# Patient Record
Sex: Male | Born: 1990 | Race: White | Hispanic: No | Marital: Single | Smoking: Current every day smoker
Health system: Southern US, Community
[De-identification: ages and names within clinical notes are randomized; demographics above are authoritative.]

## PROBLEM LIST (undated history)

## (undated) DIAGNOSIS — Z8669 Personal history of other diseases of the nervous system and sense organs: Secondary | ICD-10-CM

## (undated) HISTORY — PX: TYMPANOSTOMY TUBE PLACEMENT: SHX32

---
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
## 2015-03-23 ENCOUNTER — Encounter (HOSPITAL_COMMUNITY): Payer: Self-pay | Admitting: Emergency Medicine

## 2015-03-23 ENCOUNTER — Observation Stay (HOSPITAL_COMMUNITY)
Admission: EM | Admit: 2015-03-23 | Discharge: 2015-03-25 | Disposition: A | Discharge status: Home / Self Care | Payer: 59 | Attending: General Surgery | Admitting: General Surgery

## 2015-03-23 DIAGNOSIS — K3580 Unspecified acute appendicitis: Secondary | ICD-10-CM | POA: Diagnosis present

## 2015-03-23 DIAGNOSIS — K37 Unspecified appendicitis: Secondary | ICD-10-CM | POA: Diagnosis present

## 2015-03-23 DIAGNOSIS — F17210 Nicotine dependence, cigarettes, uncomplicated: Secondary | ICD-10-CM | POA: Diagnosis not present

## 2015-03-23 HISTORY — DX: Personal history of other diseases of the nervous system and sense organs: Z86.69

## 2015-03-23 LAB — COMPREHENSIVE METABOLIC PANEL
ALT: 13 U/L — AB (ref 17–63)
ANION GAP: 12 (ref 5–15)
AST: 17 U/L (ref 15–41)
Albumin: 4.6 g/dL (ref 3.5–5.0)
Alkaline Phosphatase: 33 U/L — ABNORMAL LOW (ref 38–126)
BUN: 10 mg/dL (ref 6–20)
CALCIUM: 9.7 mg/dL (ref 8.9–10.3)
CHLORIDE: 101 mmol/L (ref 101–111)
CO2: 24 mmol/L (ref 22–32)
Creatinine, Ser: 1.06 mg/dL (ref 0.61–1.24)
GLUCOSE: 92 mg/dL (ref 65–99)
POTASSIUM: 3.4 mmol/L — AB (ref 3.5–5.1)
Sodium: 137 mmol/L (ref 135–145)
TOTAL PROTEIN: 7.5 g/dL (ref 6.5–8.1)
Total Bilirubin: 1 mg/dL (ref 0.3–1.2)

## 2015-03-23 LAB — CBC WITH DIFFERENTIAL/PLATELET
BASOS ABS: 0 10*3/uL (ref 0.0–0.1)
Basophils Relative: 0 % (ref 0–1)
EOS ABS: 0.1 10*3/uL (ref 0.0–0.7)
Eosinophils Relative: 1 % (ref 0–5)
HCT: 43.7 % (ref 39.0–52.0)
Hemoglobin: 15.7 g/dL (ref 13.0–17.0)
Lymphocytes Relative: 22 % (ref 12–46)
Lymphs Abs: 2.5 10*3/uL (ref 0.7–4.0)
MCH: 32.9 pg (ref 26.0–34.0)
MCHC: 35.9 g/dL (ref 30.0–36.0)
MCV: 91.6 fL (ref 78.0–100.0)
Monocytes Absolute: 1.2 10*3/uL — ABNORMAL HIGH (ref 0.1–1.0)
Monocytes Relative: 10 % (ref 3–12)
NEUTROS ABS: 7.9 10*3/uL — AB (ref 1.7–7.7)
NEUTROS PCT: 67 % (ref 43–77)
PLATELETS: 184 10*3/uL (ref 150–400)
RBC: 4.77 MIL/uL (ref 4.22–5.81)
RDW: 11.7 % (ref 11.5–15.5)
WBC: 11.7 10*3/uL — ABNORMAL HIGH (ref 4.0–10.5)

## 2015-03-23 LAB — URINALYSIS, ROUTINE W REFLEX MICROSCOPIC
Bilirubin Urine: NEGATIVE
Glucose, UA: NEGATIVE mg/dL
Hgb urine dipstick: NEGATIVE
Ketones, ur: NEGATIVE mg/dL
LEUKOCYTES UA: NEGATIVE
NITRITE: NEGATIVE
PROTEIN: NEGATIVE mg/dL
SPECIFIC GRAVITY, URINE: 1.025 (ref 1.005–1.030)
UROBILINOGEN UA: 0.2 mg/dL (ref 0.0–1.0)
pH: 7.5 (ref 5.0–8.0)

## 2015-03-23 LAB — LIPASE, BLOOD: LIPASE: 18 U/L — AB (ref 22–51)

## 2015-03-23 MED ORDER — ONDANSETRON HCL 4 MG/2ML IJ SOLN
4.0000 mg | Freq: Once | INTRAMUSCULAR | Status: AC
  Administered 2015-03-23: 4 mg via INTRAVENOUS
  Filled 2015-03-23: qty 2

## 2015-03-23 MED ORDER — LOPERAMIDE HCL 2 MG PO CAPS
4.0000 mg | ORAL_CAPSULE | Freq: Once | ORAL | Status: DC
  Filled 2015-03-23: qty 2

## 2015-03-23 MED ORDER — SODIUM CHLORIDE 0.9 % IV BOLUS (SEPSIS)
1000.0000 mL | Freq: Once | INTRAVENOUS | Status: AC
  Administered 2015-03-23: 1000 mL via INTRAVENOUS

## 2015-03-23 MED ORDER — MORPHINE SULFATE 4 MG/ML IJ SOLN
4.0000 mg | Freq: Once | INTRAMUSCULAR | Status: AC
  Administered 2015-03-23: 4 mg via INTRAVENOUS
  Filled 2015-03-23: qty 1

## 2015-03-23 NOTE — ED Provider Notes (Signed)
CSN: [id number]     Arrival date & time 03/23/15  2036 History  This chart was scribed for Shon Baton, MD by Annye Asa, ED Scribe. This patient was seen in room B15C/B15C and the patient's care was started at 11:35 PM.    Chief Complaint  Patient presents with  . Abdominal Pain   The history is provided by the patient. No language interpreter was used.    HPI Comments: Keith Glenn is a 24 y.o. male who presents to the Emergency Department complaining of 1 day of gradually worsening intermittent abdominal pain with diarrhea and vomiting (3x). He describes his pain as a "tightness," currently rated 7-8/10; it is increased with movement and sitting upright; it improves when lying supine. Patient explains his pain originated in the periumbilical region and then radiated to the RLQ throughout the day. He states he has not been able to have a bowel movement since this morning and has had decreased appetite today. Patient reports he attempted to rest without relief; no other treatments or medications tried PTA.   He has both his gallbladder and appendix. He does not take any daily medications; no known drug allergies. He is an everyday smoker.   History reviewed. No pertinent past medical history. History reviewed. No pertinent past surgical history. No family history on file. History  Substance Use Topics  . Smoking status: Current Every Day Smoker  . Smokeless tobacco: Not on file  . Alcohol Use: Yes    Review of Systems  Constitutional: Negative.  Negative for fever.  Respiratory: Negative.  Negative for chest tightness and shortness of breath.   Cardiovascular: Negative.  Negative for chest pain.  Gastrointestinal: Positive for nausea, vomiting, abdominal pain and diarrhea.  Genitourinary: Negative.  Negative for dysuria.  Musculoskeletal: Negative for back pain.  Neurological: Negative for headaches.  All other systems reviewed and are negative.     Allergies  Review  of patient's allergies indicates no known allergies.  Home Medications   Prior to Admission medications   Not on File   BP 122/86 mmHg  Pulse 77  Temp(Src) 99.1 F (37.3 C) (Oral)  Resp 16  SpO2 99% Physical Exam  Constitutional: He is oriented to person, place, and time. He appears well-developed and well-nourished.  Tall, thin  HENT:  Head: Normocephalic and atraumatic.  Eyes: Pupils are equal, round, and reactive to light.  Cardiovascular: Normal rate, regular rhythm and normal heart sounds.   No murmur heard. Pulmonary/Chest: Effort normal and breath sounds normal. No respiratory distress. He has no wheezes.  Abdominal: Soft. Bowel sounds are normal. There is tenderness. There is no rebound.  Tenderness to palpation over the right lower quadrant, no rebound or guarding, positive Rovsing's  Musculoskeletal: He exhibits no edema.  Neurological: He is alert and oriented to person, place, and time.  Skin: Skin is warm and dry.  Psychiatric: He has a normal mood and affect.  Nursing note and vitals reviewed.   ED Course  Procedures   DIAGNOSTIC STUDIES: Oxygen Saturation is 100% on RA, normal by my interpretation.    COORDINATION OF CARE: 11:42 PM Discussed treatment plan with pt at bedside and pt agreed to plan.   Labs Review Labs Reviewed  CBC WITH DIFFERENTIAL/PLATELET - Abnormal; Notable for the following:    WBC 11.7 (*)    Neutro Abs 7.9 (*)    Monocytes Absolute 1.2 (*)    All other components within normal limits  COMPREHENSIVE METABOLIC PANEL - Abnormal;  Notable for the following:    Potassium 3.4 (*)    ALT 13 (*)    Alkaline Phosphatase 33 (*)    All other components within normal limits  LIPASE, BLOOD - Abnormal; Notable for the following:    Lipase 18 (*)    All other components within normal limits  URINALYSIS, ROUTINE W REFLEX MICROSCOPIC (NOT AT [facility]) - Abnormal; Notable for the following:    APPearance CLOUDY (*)    All other components within  normal limits    Imaging Review Ct Abdomen Pelvis W Contrast  03/24/2015   CLINICAL DATA:  Abdominal pain  EXAM: CT ABDOMEN AND PELVIS WITH CONTRAST  TECHNIQUE: Multidetector CT imaging of the abdomen and pelvis was performed using the standard protocol following bolus administration of intravenous contrast.  CONTRAST:  100 mL Isovue 370 intravenous  COMPARISON:  None.  FINDINGS: There is moderate dilatation and mural thickening of the appendix. There is an appendicolith at the appendiceal base. The findings are typical of acute appendicitis. There is no abscess. There is no drainable fluid collection.  There are normal appearances of the liver, spleen, pancreas, adrenals and kidneys.  There is no extraluminal air. There is no bowel obstruction. No other acute findings are evident.  There is no significant abnormality in the lower chest.  IMPRESSION: Acute appendicitis.   Electronically Signed   By: Ellery Plunkaniel R Mitchell M.D.   On: 03/24/2015 01:30     EKG Interpretation None      MDM   Final diagnoses:  Acute appendicitis, unspecified acute appendicitis type   Patient presents with abdominal pain.  Nontoxic. Tender on exam with no signs of peritonitis. CT scan obtained to rule out appendicitis. CT scan with evidence of acute appendicitis. Surgery, Dr. Dwain SarnaWakefield consulted. Will admit.  I personally performed the services described in this documentation, which was scribed in my presence. The recorded information has been reviewed and is accurate.      Shon Batonourtney F Cherly Erno, MD 03/24/15 0200

## 2015-03-23 NOTE — ED Notes (Signed)
Pt. reports mid/low abdominal pain with emesis and diarrhea onset this morning , denies fever or chills.

## 2015-03-24 ENCOUNTER — Encounter (HOSPITAL_COMMUNITY): Payer: Self-pay | Admitting: Vascular Surgery

## 2015-03-24 ENCOUNTER — Encounter (HOSPITAL_COMMUNITY)
Admission: EM | Disposition: A | Discharge status: Home / Self Care | Payer: Self-pay | Source: Home / Self Care | Attending: Emergency Medicine

## 2015-03-24 ENCOUNTER — Observation Stay (HOSPITAL_COMMUNITY): Payer: 59 | Admitting: Certified Registered"

## 2015-03-24 ENCOUNTER — Emergency Department (HOSPITAL_COMMUNITY): Payer: 59

## 2015-03-24 DIAGNOSIS — K37 Unspecified appendicitis: Secondary | ICD-10-CM | POA: Diagnosis present

## 2015-03-24 HISTORY — PX: LAPAROSCOPIC APPENDECTOMY: SHX408

## 2015-03-24 HISTORY — PX: APPENDECTOMY: SHX54

## 2015-03-24 SURGERY — APPENDECTOMY, LAPAROSCOPIC
Anesthesia: General | Site: Abdomen

## 2015-03-24 MED ORDER — LIDOCAINE HCL (CARDIAC) 20 MG/ML IV SOLN
INTRAVENOUS | Status: AC
  Filled 2015-03-24: qty 5

## 2015-03-24 MED ORDER — ONDANSETRON HCL 4 MG/2ML IJ SOLN
INTRAMUSCULAR | Status: AC
  Filled 2015-03-24: qty 2

## 2015-03-24 MED ORDER — PANTOPRAZOLE SODIUM 40 MG IV SOLR
40.0000 mg | INTRAVENOUS | Status: DC
  Administered 2015-03-24: 40 mg via INTRAVENOUS
  Filled 2015-03-24: qty 40

## 2015-03-24 MED ORDER — SUCCINYLCHOLINE CHLORIDE 20 MG/ML IJ SOLN
INTRAMUSCULAR | Status: DC | PRN
  Administered 2015-03-24: 120 mg via INTRAVENOUS

## 2015-03-24 MED ORDER — ENOXAPARIN SODIUM 40 MG/0.4ML ~~LOC~~ SOLN
40.0000 mg | SUBCUTANEOUS | Status: DC
  Administered 2015-03-25: 40 mg via SUBCUTANEOUS
  Filled 2015-03-24: qty 0.4

## 2015-03-24 MED ORDER — MIDAZOLAM HCL 5 MG/5ML IJ SOLN
INTRAMUSCULAR | Status: DC | PRN
  Administered 2015-03-24: 2 mg via INTRAVENOUS

## 2015-03-24 MED ORDER — DEXAMETHASONE SODIUM PHOSPHATE 4 MG/ML IJ SOLN
INTRAMUSCULAR | Status: AC
  Filled 2015-03-24: qty 1

## 2015-03-24 MED ORDER — FENTANYL CITRATE (PF) 100 MCG/2ML IJ SOLN
INTRAMUSCULAR | Status: DC | PRN
  Administered 2015-03-24: 100 ug via INTRAVENOUS

## 2015-03-24 MED ORDER — HYDROMORPHONE HCL 1 MG/ML IJ SOLN
0.2500 mg | INTRAMUSCULAR | Status: DC | PRN
  Administered 2015-03-24 (×4): 0.5 mg via INTRAVENOUS

## 2015-03-24 MED ORDER — LIDOCAINE HCL (CARDIAC) 20 MG/ML IV SOLN
INTRAVENOUS | Status: DC | PRN
  Administered 2015-03-24: 40 mg via INTRAVENOUS

## 2015-03-24 MED ORDER — GI COCKTAIL ~~LOC~~
30.0000 mL | Freq: Two times a day (BID) | ORAL | Status: DC | PRN
  Filled 2015-03-24: qty 30

## 2015-03-24 MED ORDER — FENTANYL CITRATE (PF) 250 MCG/5ML IJ SOLN
INTRAMUSCULAR | Status: AC
  Filled 2015-03-24: qty 5

## 2015-03-24 MED ORDER — BUPIVACAINE-EPINEPHRINE 0.25% -1:200000 IJ SOLN
INTRAMUSCULAR | Status: DC | PRN
  Administered 2015-03-24: 30 mL

## 2015-03-24 MED ORDER — DEXAMETHASONE SODIUM PHOSPHATE 4 MG/ML IJ SOLN
INTRAMUSCULAR | Status: DC | PRN
  Administered 2015-03-24: 4 mg via INTRAVENOUS

## 2015-03-24 MED ORDER — MIDAZOLAM HCL 2 MG/2ML IJ SOLN
INTRAMUSCULAR | Status: AC
  Filled 2015-03-24: qty 2

## 2015-03-24 MED ORDER — ROCURONIUM BROMIDE 100 MG/10ML IV SOLN
INTRAVENOUS | Status: DC | PRN
  Administered 2015-03-24: 25 mg via INTRAVENOUS

## 2015-03-24 MED ORDER — SODIUM CHLORIDE 0.9 % IV SOLN
INTRAVENOUS | Status: DC
  Administered 2015-03-24: 14:00:00 via INTRAVENOUS
  Administered 2015-03-24: 500 mL via INTRAVENOUS

## 2015-03-24 MED ORDER — ARTIFICIAL TEARS OP OINT
TOPICAL_OINTMENT | OPHTHALMIC | Status: AC
  Filled 2015-03-24: qty 3.5

## 2015-03-24 MED ORDER — LACTATED RINGERS IV SOLN
INTRAVENOUS | Status: DC
Start: 2015-03-24 — End: 2015-03-24
  Administered 2015-03-24: 10:00:00 via INTRAVENOUS

## 2015-03-24 MED ORDER — PROMETHAZINE HCL 25 MG/ML IJ SOLN: 6.2500 mg | INTRAMUSCULAR | Status: DC | PRN

## 2015-03-24 MED ORDER — SODIUM CHLORIDE 0.9 % IR SOLN
Status: DC | PRN
  Administered 2015-03-24: 1000 mL

## 2015-03-24 MED ORDER — PROPOFOL 10 MG/ML IV BOLUS
INTRAVENOUS | Status: AC
  Filled 2015-03-24: qty 20

## 2015-03-24 MED ORDER — NEOSTIGMINE METHYLSULFATE 10 MG/10ML IV SOLN
INTRAVENOUS | Status: AC
  Filled 2015-03-24: qty 1

## 2015-03-24 MED ORDER — METRONIDAZOLE IN NACL 5-0.79 MG/ML-% IV SOLN
500.0000 mg | Freq: Three times a day (TID) | INTRAVENOUS | Status: DC
  Administered 2015-03-24 (×2): 500 mg via INTRAVENOUS
  Filled 2015-03-24 (×4): qty 100

## 2015-03-24 MED ORDER — NEOSTIGMINE METHYLSULFATE 10 MG/10ML IV SOLN
INTRAVENOUS | Status: DC | PRN
  Administered 2015-03-24: 3 mg via INTRAVENOUS

## 2015-03-24 MED ORDER — HYDROMORPHONE HCL 1 MG/ML IJ SOLN
1.0000 mg | INTRAMUSCULAR | Status: DC | PRN
  Administered 2015-03-24: 1 mg via INTRAVENOUS
  Filled 2015-03-24: qty 1

## 2015-03-24 MED ORDER — ONDANSETRON HCL 4 MG/2ML IJ SOLN
4.0000 mg | Freq: Four times a day (QID) | INTRAMUSCULAR | Status: DC | PRN
  Administered 2015-03-24: 4 mg via INTRAVENOUS
  Filled 2015-03-24: qty 2

## 2015-03-24 MED ORDER — OXYCODONE-ACETAMINOPHEN 5-325 MG PO TABS
1.0000 | ORAL_TABLET | ORAL | Status: DC | PRN
  Administered 2015-03-24: 1 via ORAL
  Administered 2015-03-24: 2 via ORAL
  Administered 2015-03-24: 1 via ORAL
  Administered 2015-03-25: 2 via ORAL
  Filled 2015-03-24: qty 1
  Filled 2015-03-24: qty 2
  Filled 2015-03-24: qty 1
  Filled 2015-03-24 (×2): qty 2

## 2015-03-24 MED ORDER — MORPHINE SULFATE 2 MG/ML IJ SOLN
2.0000 mg | INTRAMUSCULAR | Status: DC | PRN
  Administered 2015-03-24 (×2): 2 mg via INTRAVENOUS
  Filled 2015-03-24 (×2): qty 1

## 2015-03-24 MED ORDER — HYDROMORPHONE HCL 1 MG/ML IJ SOLN
INTRAMUSCULAR | Status: AC
  Filled 2015-03-24: qty 1

## 2015-03-24 MED ORDER — PROPOFOL 10 MG/ML IV BOLUS
INTRAVENOUS | Status: DC | PRN
  Administered 2015-03-24: 270 mg via INTRAVENOUS

## 2015-03-24 MED ORDER — MORPHINE SULFATE 2 MG/ML IJ SOLN
1.0000 mg | INTRAMUSCULAR | Status: DC | PRN
  Administered 2015-03-24: 2 mg via INTRAVENOUS
  Administered 2015-03-24: 3 mg via INTRAVENOUS
  Administered 2015-03-24: 2 mg via INTRAVENOUS
  Filled 2015-03-24 (×4): qty 1

## 2015-03-24 MED ORDER — MEPERIDINE HCL 25 MG/ML IJ SOLN: 6.2500 mg | INTRAMUSCULAR | Status: DC | PRN

## 2015-03-24 MED ORDER — HYDROMORPHONE HCL 1 MG/ML IJ SOLN
INTRAMUSCULAR | Status: AC
  Administered 2015-03-24: 0.5 mg via INTRAVENOUS
  Filled 2015-03-24: qty 1

## 2015-03-24 MED ORDER — HYDROMORPHONE HCL 1 MG/ML IJ SOLN
INTRAMUSCULAR | Status: AC
  Administered 2015-03-24: 02:00:00
  Filled 2015-03-24: qty 1

## 2015-03-24 MED ORDER — MIDAZOLAM HCL 2 MG/2ML IJ SOLN: 0.5000 mg | Freq: Once | INTRAMUSCULAR | Status: DC | PRN

## 2015-03-24 MED ORDER — GLYCOPYRROLATE 0.2 MG/ML IJ SOLN
INTRAMUSCULAR | Status: AC
  Filled 2015-03-24: qty 2

## 2015-03-24 MED ORDER — 0.9 % SODIUM CHLORIDE (POUR BTL) OPTIME
TOPICAL | Status: DC | PRN
  Administered 2015-03-24: 1000 mL

## 2015-03-24 MED ORDER — GLYCOPYRROLATE 0.2 MG/ML IJ SOLN
INTRAMUSCULAR | Status: DC | PRN
  Administered 2015-03-24: 0.4 mg via INTRAVENOUS

## 2015-03-24 MED ORDER — BUPIVACAINE-EPINEPHRINE (PF) 0.25% -1:200000 IJ SOLN
INTRAMUSCULAR | Status: AC
  Filled 2015-03-24: qty 30

## 2015-03-24 MED ORDER — KETOROLAC TROMETHAMINE 30 MG/ML IJ SOLN
INTRAMUSCULAR | Status: DC | PRN
  Administered 2015-03-24: 30 mg via INTRAVENOUS

## 2015-03-24 MED ORDER — DEXTROSE 5 % IV SOLN
2.0000 g | INTRAVENOUS | Status: DC
  Administered 2015-03-24: 2 g via INTRAVENOUS
  Filled 2015-03-24: qty 2

## 2015-03-24 MED ORDER — ONDANSETRON HCL 4 MG/2ML IJ SOLN
INTRAMUSCULAR | Status: DC | PRN
  Administered 2015-03-24: 4 mg via INTRAVENOUS

## 2015-03-24 MED ORDER — IOHEXOL 300 MG/ML  SOLN
100.0000 mL | Freq: Once | INTRAMUSCULAR | Status: AC | PRN
  Administered 2015-03-24: 100 mL via INTRAVENOUS

## 2015-03-24 MED ORDER — KETOROLAC TROMETHAMINE 30 MG/ML IJ SOLN
30.0000 mg | Freq: Three times a day (TID) | INTRAMUSCULAR | Status: DC | PRN
  Administered 2015-03-24: 30 mg via INTRAVENOUS
  Filled 2015-03-24: qty 1

## 2015-03-24 MED ORDER — ROCURONIUM BROMIDE 50 MG/5ML IV SOLN
INTRAVENOUS | Status: AC
  Filled 2015-03-24: qty 1

## 2015-03-24 SURGICAL SUPPLY — 49 items
APPLIER CLIP ROT 10 11.4 M/L (STAPLE)
BENZOIN TINCTURE PRP APPL 2/3 (GAUZE/BANDAGES/DRESSINGS) IMPLANT
BLADE SURG ROTATE 9660 (MISCELLANEOUS) ×2 IMPLANT
CANISTER SUCTION 2500CC (MISCELLANEOUS) ×2 IMPLANT
CHLORAPREP W/TINT 26ML (MISCELLANEOUS) ×2 IMPLANT
CLIP APPLIE ROT 10 11.4 M/L (STAPLE) IMPLANT
COVER SURGICAL LIGHT HANDLE (MISCELLANEOUS) ×2 IMPLANT
CUTTER FLEX LINEAR 45M (STAPLE) ×2 IMPLANT
DRAPE LAPAROSCOPIC ABDOMINAL (DRAPES) ×2 IMPLANT
ELECT REM PT RETURN 9FT ADLT (ELECTROSURGICAL) ×2
ELECTRODE REM PT RTRN 9FT ADLT (ELECTROSURGICAL) ×1 IMPLANT
ENDOLOOP SUT PDS II  0 18 (SUTURE)
ENDOLOOP SUT PDS II 0 18 (SUTURE) IMPLANT
GAUZE SPONGE 2X2 8PLY STRL LF (GAUZE/BANDAGES/DRESSINGS) IMPLANT
GLOVE BIO SURGEON STRL SZ 6.5 (GLOVE) ×2 IMPLANT
GLOVE BIOGEL M STRL SZ7.5 (GLOVE) ×2 IMPLANT
GLOVE BIOGEL PI IND STRL 6 (GLOVE) ×1 IMPLANT
GLOVE BIOGEL PI IND STRL 7.0 (GLOVE) ×2 IMPLANT
GLOVE BIOGEL PI IND STRL 8 (GLOVE) ×1 IMPLANT
GLOVE BIOGEL PI INDICATOR 6 (GLOVE) ×1
GLOVE BIOGEL PI INDICATOR 7.0 (GLOVE) ×2
GLOVE BIOGEL PI INDICATOR 8 (GLOVE) ×1
GLOVE SURG SS PI 7.0 STRL IVOR (GLOVE) ×2 IMPLANT
GOWN STRL REUS W/ TWL LRG LVL3 (GOWN DISPOSABLE) ×2 IMPLANT
GOWN STRL REUS W/ TWL XL LVL3 (GOWN DISPOSABLE) ×1 IMPLANT
GOWN STRL REUS W/TWL LRG LVL3 (GOWN DISPOSABLE) ×2
GOWN STRL REUS W/TWL XL LVL3 (GOWN DISPOSABLE) ×1
KIT BASIN OR (CUSTOM PROCEDURE TRAY) ×2 IMPLANT
KIT ROOM TURNOVER OR (KITS) ×2 IMPLANT
LIQUID BAND (GAUZE/BANDAGES/DRESSINGS) ×2 IMPLANT
NS IRRIG 1000ML POUR BTL (IV SOLUTION) ×2 IMPLANT
PAD ARMBOARD 7.5X6 YLW CONV (MISCELLANEOUS) ×4 IMPLANT
POUCH SPECIMEN RETRIEVAL 10MM (ENDOMECHANICALS) ×2 IMPLANT
RELOAD 45 VASCULAR/THIN (ENDOMECHANICALS) IMPLANT
RELOAD STAPLE TA45 3.5 REG BLU (ENDOMECHANICALS) ×2 IMPLANT
SCALPEL HARMONIC ACE (MISCELLANEOUS) ×2 IMPLANT
SCISSORS LAP 5X35 DISP (ENDOMECHANICALS) IMPLANT
SET IRRIG TUBING LAPAROSCOPIC (IRRIGATION / IRRIGATOR) ×2 IMPLANT
SPECIMEN JAR SMALL (MISCELLANEOUS) ×2 IMPLANT
SPONGE GAUZE 2X2 STER 10/PKG (GAUZE/BANDAGES/DRESSINGS)
SUT MNCRL AB 4-0 PS2 18 (SUTURE) ×2 IMPLANT
SUT VICRYL 0 UR6 27IN ABS (SUTURE) IMPLANT
TOWEL OR 17X24 6PK STRL BLUE (TOWEL DISPOSABLE) ×2 IMPLANT
TOWEL OR 17X26 10 PK STRL BLUE (TOWEL DISPOSABLE) ×2 IMPLANT
TRAY FOLEY CATH 16FR SILVER (SET/KITS/TRAYS/PACK) ×2 IMPLANT
TRAY LAPAROSCOPIC (CUSTOM PROCEDURE TRAY) ×2 IMPLANT
TROCAR XCEL BLADELESS 5X75MML (TROCAR) ×4 IMPLANT
TROCAR XCEL BLUNT TIP 100MML (ENDOMECHANICALS) ×2 IMPLANT
TUBING INSUFFLATION (TUBING) ×2 IMPLANT

## 2015-03-24 NOTE — ED Notes (Signed)
See downtime charting. 

## 2015-03-24 NOTE — Interval H&P Note (Signed)
History and Physical Interval Note:  03/24/2015 10:23 AM  Keith Glenn  has presented today for surgery, with the diagnosis of Appendicitis  The various methods of treatment have been discussed with the patient and family. After consideration of risks, benefits and other options for treatment, the patient has consented to  Procedure(s): APPENDECTOMY LAPAROSCOPIC (N/A) as a surgical intervention .  The patient's history has been reviewed, patient examined, no change in status, stable for surgery.  I have reviewed the patient's chart and labs.  Questions were answered to the patient's satisfaction.    Chart reviewed. History reobtained Alert, nontoxic Soft, TTP throughout. Localized peritonitis in RLQ.   We discussed the etiology and management of acute appendicitis. We discussed operative and nonoperative management.  I recommended operative management along with IV antibiotics.  We discussed laparoscopic appendectomy. We discussed the risk and benefits of surgery including but not limited to bleeding, infection, injury to surrounding structures, need to convert to an open procedure, blood clot formation, post operative abscess or wound infection, staple line complications such as leak or bleeding, hernia formation, post operative ileus, need for additional procedures, anesthesia complications, and the typical postoperative course. I explained that the patient should expect a good improvement in their symptoms.  Mary SellaEric M. Andrey CampanileWilson, MD, FACS General, Bariatric, & Minimally Invasive Surgery [facility] [place]  [facility],[name]

## 2015-03-24 NOTE — Anesthesia Postprocedure Evaluation (Signed)
  Anesthesia Post-op Note  Patient: Keith Glenn  Procedure(s) Performed: Procedure(s): APPENDECTOMY LAPAROSCOPIC (N/A)  Patient Location: PACU  Anesthesia Type:General  Level of Consciousness: awake, alert  and oriented  Airway and Oxygen Therapy: Patient Spontanous Breathing and Patient connected to nasal cannula oxygen  Post-op Pain: mild  Post-op Assessment: Post-op Vital signs reviewed, Patient's Cardiovascular Status Stable, Respiratory Function Stable, Patent Airway and Pain level controlled              Post-op Vital Signs: stable  Last Vitals:  Filed Vitals:   03/24/15 1250  BP:   Pulse:   Temp: 36.6 C  Resp:     Complications: No apparent anesthesia complications

## 2015-03-24 NOTE — Progress Notes (Signed)
ANTIBIOTIC CONSULT NOTE - INITIAL  Pharmacy Consult for Ceftriaxone Indication: Appendicitis  No Known Allergies  Vital Signs: Temp: 99.1 F (37.3 C) (06/07 2050) Temp Source: Oral (06/07 2050) BP: 114/65 mmHg (06/08 0200) Pulse Rate: 78 (06/08 0200) Intake/Output from previous day: 06/07 0701 - 06/08 0700 In: 1000 [I.V.:1000] Out: -  Intake/Output from this shift: Total I/O In: 1000 [I.V.:1000] Out: -   Labs:  Recent Labs  03/23/15 2102  WBC 11.7*  HGB 15.7  PLT 184  CREATININE 1.06    Assessment: 24 y/o with abdomina pain, N/V/D, found to have appendicitis. To OR this AM.   Plan:  -Ceftriaxone 2g IV Vq24h -F/U post-op  Abran DukeLedford, Yareliz Thorstenson 03/24/2015,2:04 AM

## 2015-03-24 NOTE — Op Note (Signed)
Keith Glenn [id number] 03/16/1991 03/24/2015  Appendectomy, Lap, Procedure Note  Indications: The patient presented with a history of right-sided abdominal pain. A CT revealed findings consistent with acute appendicitis.  Pre-operative Diagnosis: acute appendicitis  Post-operative Diagnosis: Same  Surgeon: Atilano InaWILSON,Jaqwon Manfred M   Assistants: none  Anesthesia: General endotracheal anesthesia  ASA Class: 2  Procedure Details  The patient was seen again in the Holding Room. The risks, benefits, complications, treatment options, and expected outcomes were discussed with the patient and/or family. The possibilities of perforation of viscus, bleeding, recurrent infection, the need for additional procedures, failure to diagnose a condition, and creating a complication requiring transfusion or operation were discussed. There was concurrence with the proposed plan and informed consent was obtained. The site of surgery was properly noted. The patient was taken to Operating Room, identified as Keith Glenn and the procedure verified as Appendectomy. A Time Out was held and the above information confirmed. He was scheduled therapeutic antibiotics.   The patient was placed in the supine position and general anesthesia was induced, along with placement of orogastric tube, SCDs, and a Foley catheter. The abdomen was prepped and draped in a sterile fashion. A 1.5 centimeter infraumbilical incision was made.  The umbilical stalk was elevated, and the midline fascia was incised with a #11 blade.  A Kelly clamp was used to confirm entrance into the peritoneal cavity.  A pursestring suture was passed around the incision with a 0 Vicryl.  A 12mm Hasson was introduced into the abdomen and the tails of the suture were used to hold the Hasson in place.   The pneumoperitoneum was then established to steady pressure of 15 mmHg.  Additional 5 mm cannulas then placed in the left lower quadrant of the abdomen and the  suprapubic region under direct visualization. A careful evaluation of the entire abdomen was carried out. The patient was placed in Trendelenburg and left lateral decubitus position.  The patient was found to have an inflammed appendix that was extending into the pelvis. There was no evidence of perforation.  The appendix was carefully dissected. The appendix was was skeletonized with the harmonic scalpel.   The appendix was divided at its base using an endo-GIA stapler with a blue load. No appendiceal stump was left in place. The appendix was removed from the abdomen with an Endocatch bag through the umbilical port.  There was no evidence of bleeding, leakage, or complication after division of the appendix. Irrigation was also performed and irrigate suctioned from the abdomen as well.  The umbilical port site was closed with the purse string suture. The closure was viewed laparoscopically. There was no residual palpable fascial defect. Additional local was infiltrated at all trocar sites. The trocar site skin wounds were closed with 4-0 Monocryl. Liquidband exceed was applied to the skin incisions.  Instrument, sponge, and needle counts were correct at the conclusion of the case.   Findings: The appendix was found to be inflamed. There were not signs of necrosis.  There was not perforation. There was not abscess formation.  Estimated Blood Loss:  Minimal         Drains: none         Specimens: appendix         Complications:  None; patient tolerated the procedure well.         Disposition: PACU - hemodynamically stable.         Condition: stable  Mary SellaEric M. Andrey CampanileWilson, MD, FACS General, Bariatric, & Minimally  Invasive Surgery [facility], [place]

## 2015-03-24 NOTE — Transfer of Care (Signed)
Immediate Anesthesia Transfer of Care Note  Patient: Keith DelaineZachary T Davidovich  Procedure(s) Performed: Procedure(s): APPENDECTOMY LAPAROSCOPIC (N/A)  Patient Location: PACU  Anesthesia Type:General  Level of Consciousness: awake, alert  and oriented  Airway & Oxygen Therapy: Patient Spontanous Breathing and Patient connected to nasal cannula oxygen  Post-op Assessment: Report given to RN  Post vital signs: Reviewed and stable  Last Vitals:  Filed Vitals:   03/24/15 0830  BP: 107/61  Pulse: 63  Temp:   Resp:     Complications: No apparent anesthesia complications

## 2015-03-24 NOTE — Anesthesia Postprocedure Evaluation (Signed)
  Anesthesia Post-op Note  Patient: Keith Glenn  Procedure(s) Performed: Procedure(s): APPENDECTOMY LAPAROSCOPIC (N/A)  Patient Location: PACU  Anesthesia Type:General  Level of Consciousness: awake and alert   Airway and Oxygen Therapy: Patient Spontanous Breathing  Post-op Pain: mild  Post-op Assessment: Post-op Vital signs reviewed, Patient's Cardiovascular Status Stable and Respiratory Function Stable  Post-op Vital Signs: Reviewed  Filed Vitals:   03/24/15 1250  BP:   Pulse:   Temp: 36.6 C  Resp:     Complications: No apparent anesthesia complications

## 2015-03-24 NOTE — H&P (Signed)
Keith Glenn is an 24 y.o. male.   Chief Complaint: ab pain HPI: 66 yom presents with less than 24 hours of abd pain beginning periumbilical but now in lower abdomen. Worse throughout day, no relief.  Hurts when he moves. Some n/v/d.  No fevers. Decreased appetite   History reviewed. No pertinent past medical history.  History reviewed. No pertinent past surgical history.  No family history on file. Social History:  reports that he has been smoking.  He does not have any smokeless tobacco history on file. He reports that he drinks alcohol. He reports that he uses illicit drugs (Marijuana).  Allergies: No Known Allergies  Meds none  Results for orders placed or performed during the hospital encounter of 03/23/15 (from the past 48 hour(s))  CBC with Differential     Status: Abnormal   Collection Time: 03/23/15  9:02 PM  Result Value Ref Range   WBC 11.7 (H) 4.0 - 10.5 K/uL   RBC 4.77 4.22 - 5.81 MIL/uL   Hemoglobin 15.7 13.0 - 17.0 g/dL   HCT 99.7 97.0 - 62.0 %   MCV 91.6 78.0 - 100.0 fL   MCH 32.9 26.0 - 34.0 pg   MCHC 35.9 30.0 - 36.0 g/dL   RDW 67.9 40.1 - 89.9 %   Platelets 184 150 - 400 K/uL   Neutrophils Relative % 67 43 - 77 %   Neutro Abs 7.9 (H) 1.7 - 7.7 K/uL   Lymphocytes Relative 22 12 - 46 %   Lymphs Abs 2.5 0.7 - 4.0 K/uL   Monocytes Relative 10 3 - 12 %   Monocytes Absolute 1.2 (H) 0.1 - 1.0 K/uL   Eosinophils Relative 1 0 - 5 %   Eosinophils Absolute 0.1 0.0 - 0.7 K/uL   Basophils Relative 0 0 - 1 %   Basophils Absolute 0.0 0.0 - 0.1 K/uL  Comprehensive metabolic panel     Status: Abnormal   Collection Time: 03/23/15  9:02 PM  Result Value Ref Range   Sodium 137 135 - 145 mmol/L   Potassium 3.4 (L) 3.5 - 5.1 mmol/L   Chloride 101 101 - 111 mmol/L   CO2 24 22 - 32 mmol/L   Glucose, Bld 92 65 - 99 mg/dL   BUN 10 6 - 20 mg/dL   Creatinine, Ser 2.12 0.61 - 1.24 mg/dL   Calcium 9.7 8.9 - 35.6 mg/dL   Total Protein 7.5 6.5 - 8.1 g/dL   Albumin 4.6 3.5 -  5.0 g/dL   AST 17 15 - 41 U/L   ALT 13 (L) 17 - 63 U/L   Alkaline Phosphatase 33 (L) 38 - 126 U/L   Total Bilirubin 1.0 0.3 - 1.2 mg/dL   GFR calc non Af Amer >60 >60 mL/min   GFR calc Af Amer >60 >60 mL/min    Comment: (NOTE) The eGFR has been calculated using the CKD EPI equation. This calculation has not been validated in all clinical situations. eGFR's persistently <60 mL/min signify possible Chronic Kidney Disease.    Anion gap 12 5 - 15  Lipase, blood     Status: Abnormal   Collection Time: 03/23/15  9:02 PM  Result Value Ref Range   Lipase 18 (L) 22 - 51 U/L  Urinalysis, Routine w reflex microscopic (not at [facility])     Status: Abnormal   Collection Time: 03/23/15  9:02 PM  Result Value Ref Range   Color, Urine YELLOW YELLOW   APPearance CLOUDY (A) CLEAR  Specific Gravity, Urine 1.025 1.005 - 1.030   pH 7.5 5.0 - 8.0   Glucose, UA NEGATIVE NEGATIVE mg/dL   Hgb urine dipstick NEGATIVE NEGATIVE   Bilirubin Urine NEGATIVE NEGATIVE   Ketones, ur NEGATIVE NEGATIVE mg/dL   Protein, ur NEGATIVE NEGATIVE mg/dL   Urobilinogen, UA 0.2 0.0 - 1.0 mg/dL   Nitrite NEGATIVE NEGATIVE   Leukocytes, UA NEGATIVE NEGATIVE    Comment: MICROSCOPIC NOT DONE ON URINES WITH NEGATIVE PROTEIN, BLOOD, LEUKOCYTES, NITRITE, OR GLUCOSE <1000 mg/dL.   Ct Abdomen Pelvis W Contrast  03/24/2015   CLINICAL DATA:  Abdominal pain  EXAM: CT ABDOMEN AND PELVIS WITH CONTRAST  TECHNIQUE: Multidetector CT imaging of the abdomen and pelvis was performed using the standard protocol following bolus administration of intravenous contrast.  CONTRAST:  100 mL Isovue 370 intravenous  COMPARISON:  None.  FINDINGS: There is moderate dilatation and mural thickening of the appendix. There is an appendicolith at the appendiceal base. The findings are typical of acute appendicitis. There is no abscess. There is no drainable fluid collection.  There are normal appearances of the liver, spleen, pancreas, adrenals and kidneys.  There  is no extraluminal air. There is no bowel obstruction. No other acute findings are evident.  There is no significant abnormality in the lower chest.  IMPRESSION: Acute appendicitis.   Electronically Signed   By: Andreas Newport M.D.   On: 03/24/2015 01:30    Review of Systems  Constitutional: Negative for fever and chills.  Respiratory: Negative for cough.   Gastrointestinal: Positive for nausea, vomiting, abdominal pain and diarrhea.    Blood pressure 122/86, pulse 77, temperature 99.1 F (37.3 C), temperature source Oral, resp. rate 16, SpO2 99 %. Physical Exam  Vitals reviewed. Constitutional: He is oriented to person, place, and time. He appears well-developed and well-nourished.  Neck: Neck supple.  Cardiovascular: Normal rate, regular rhythm and normal heart sounds.   Respiratory: Effort normal and breath sounds normal. He has no wheezes. He has no rales.  GI: Soft. He exhibits no distension. There is tenderness in the right lower quadrant and suprapubic area.  Neurological: He is alert and oriented to person, place, and time.     Assessment/Plan Appendicitis  Plan for admission, iv abx, npo, lap appy in am  [facility] 03/24/2015, 1:58 AM

## 2015-03-24 NOTE — Anesthesia Procedure Notes (Signed)
Procedure Name: Intubation Date/Time: 03/24/2015 10:48 AM Performed by: De NurseENNIE, Chrishon Martino E Pre-anesthesia Checklist: Patient identified, Emergency Drugs available, Suction available, Patient being monitored and Timeout performed Patient Re-evaluated:Patient Re-evaluated prior to inductionOxygen Delivery Method: Circle system utilized Preoxygenation: Pre-oxygenation with 100% oxygen Intubation Type: IV induction, Rapid sequence and Cricoid Pressure applied Laryngoscope Size: Mac and 3 Grade View: Grade I Tube type: Oral Tube size: 7.5 mm Number of attempts: 1 Airway Equipment and Method: Stylet Placement Confirmation: ETT inserted through vocal cords under direct vision,  positive ETCO2 and breath sounds checked- equal and bilateral Secured at: 22 cm Tube secured with: Tape Dental Injury: Teeth and Oropharynx as per pre-operative assessment

## 2015-03-24 NOTE — Anesthesia Preprocedure Evaluation (Addendum)
Anesthesia Evaluation  Patient identified by MRN, date of birth, ID band Patient awake    Reviewed: Allergy & Precautions, NPO status , Patient's Chart, lab work & pertinent test results  History of Anesthesia Complications Negative for: history of anesthetic complications  Airway Mallampati: I  TM Distance: >3 FB Neck ROM: Full    Dental  (+) Dental Advisory Given, Caps, Teeth Intact   Pulmonary Current Smoker,  breath sounds clear to auscultation        Cardiovascular negative cardio ROS  Rhythm:Regular Rate:Normal + Diastolic murmurs    Neuro/Psych negative neurological ROS     GI/Hepatic Neg liver ROS, N/v with acute appy   Endo/Other  negative endocrine ROS  Renal/GU negative Renal ROS     Musculoskeletal   Abdominal   Peds  Hematology negative hematology ROS (+)   Anesthesia Other Findings   Reproductive/Obstetrics                            Anesthesia Physical Anesthesia Plan  ASA: II  Anesthesia Plan: General   Post-op Pain Management:    Induction: Intravenous, Rapid sequence and Cricoid pressure planned  Airway Management Planned: Oral ETT  Additional Equipment:   Intra-op Plan:   Post-operative Plan: Extubation in OR  Informed Consent: I have reviewed the patients History and Physical, chart, labs and discussed the procedure including the risks, benefits and alternatives for the proposed anesthesia with the patient or authorized representative who has indicated his/her understanding and acceptance.   Dental advisory given  Plan Discussed with:   Anesthesia Plan Comments: (Plan routine monitors, GETA)        Anesthesia Quick Evaluation

## 2015-03-24 NOTE — ED Notes (Signed)
Dr. Wakefield, surgery, at the bedside.  

## 2015-03-25 ENCOUNTER — Encounter (HOSPITAL_COMMUNITY): Payer: Self-pay | Admitting: General Surgery

## 2015-03-25 MED ORDER — OXYCODONE-ACETAMINOPHEN 5-325 MG PO TABS: 1.0000 | ORAL_TABLET | Freq: Four times a day (QID) | ORAL | Status: DC | PRN

## 2015-03-25 MED ORDER — PANTOPRAZOLE SODIUM 40 MG PO TBEC: 40.0000 mg | DELAYED_RELEASE_TABLET | Freq: Every day | ORAL | Status: DC

## 2015-03-25 NOTE — Progress Notes (Signed)
AVS discharge instructions were reviewed with patient. Patient was given prescription for percocet to take to his pharmacy. Patient was also given a work note to take to his employer. Patient's questions were answered. Patient ambulated with friend to his transportation.

## 2015-03-25 NOTE — Discharge Summary (Signed)
   [facility] Discharge Summary   Patient ID: Keith Glenn MRN: [id number] DOB/AGE: 01/15/1991 24 y.o.  Admit date: 03/23/2015 Discharge date: 03/25/2015  Admitting Diagnosis: Acute appendicitis  Discharge Diagnosis Patient Active Problem List   Diagnosis Date Noted  . Appendicitis 03/24/2015    Consultants None  Imaging: Ct Abdomen Pelvis W Contrast  03/24/2015   CLINICAL DATA:  Abdominal pain  EXAM: CT ABDOMEN AND PELVIS WITH CONTRAST  TECHNIQUE: Multidetector CT imaging of the abdomen and pelvis was performed using the standard protocol following bolus administration of intravenous contrast.  CONTRAST:  100 mL Isovue 370 intravenous  COMPARISON:  None.  FINDINGS: There is moderate dilatation and mural thickening of the appendix. There is an appendicolith at the appendiceal base. The findings are typical of acute appendicitis. There is no abscess. There is no drainable fluid collection.  There are normal appearances of the liver, spleen, pancreas, adrenals and kidneys.  There is no extraluminal air. There is no bowel obstruction. No other acute findings are evident.  There is no significant abnormality in the lower chest.  IMPRESSION: Acute appendicitis.   Electronically Signed   By: Ellery Plunk M.D.   On: 03/24/2015 01:30    Procedures Dr. Andrey Campanile (03/24/15) - Laparoscopic Appendectomy  Hospital Course:  6 yom presents with less than 24 hours of abd pain beginning periumbilical but now in lower abdomen. Worse throughout day, no relief. Hurts when he moves. Some n/v/d. No fevers. Decreased appetite.  Workup showed acute appendicitis and mild leukocytosis.  Patient was admitted and underwent procedure listed above.  Tolerated procedure well and was transferred to the floor.  Diet was advanced as tolerated.  On POD #1, the patient was voiding well, tolerating diet, ambulating well, pain well controlled, vital signs stable, incisions c/d/i and felt stable for  discharge home.  Patient will follow up in our office in 2-3 weeks and knows to call with questions or concerns.  Physical Exam: General:  Alert, NAD, pleasant, comfortable Abd:  Soft, ND, mild tenderness, incisions C/D/I with dermabond in place     Medication List    TAKE these medications        oxyCODONE-acetaminophen 5-325 MG per tablet  Commonly known as:  PERCOCET/ROXICET  Take 1-2 tablets by mouth every 6 (six) hours as needed for moderate pain.         Follow-up Information    Follow up with [facility] [place] On 04/13/2015.   Why:  For post-operation check. Your appointment is at 3:00pm, please arrive at least 30 min before your appointment to complete your check in paperwork.  If you are unable to arrive 30 min prior to your appointment time we may have to cancel or reschedule you   Contact information:   [address] [place] [address] [phone]      Signed: Nonie Hoyer, [facility] [phone]  03/25/2015, 9:04 AM

## 2015-03-25 NOTE — Discharge Instructions (Signed)

## 2016-03-16 ENCOUNTER — Encounter: Payer: Self-pay | Admitting: Adult Health

## 2016-03-16 ENCOUNTER — Ambulatory Visit (INDEPENDENT_AMBULATORY_CARE_PROVIDER_SITE_OTHER): Payer: 59 | Admitting: Adult Health

## 2016-03-16 VITALS — BP 100/70 | Temp 97.7°F | Ht 72.0 in | Wt 142.5 lb

## 2016-03-16 DIAGNOSIS — Z0001 Encounter for general adult medical examination with abnormal findings: Secondary | ICD-10-CM | POA: Diagnosis not present

## 2016-03-16 DIAGNOSIS — Z0000 Encounter for general adult medical examination without abnormal findings: Secondary | ICD-10-CM

## 2016-03-16 DIAGNOSIS — M10071 Idiopathic gout, right ankle and foot: Secondary | ICD-10-CM | POA: Diagnosis not present

## 2016-03-16 LAB — CBC WITH DIFFERENTIAL/PLATELET
BASOS ABS: 0 10*3/uL (ref 0.0–0.1)
Basophils Relative: 0.5 % (ref 0.0–3.0)
Eosinophils Absolute: 0.1 10*3/uL (ref 0.0–0.7)
Eosinophils Relative: 1.2 % (ref 0.0–5.0)
HEMATOCRIT: 42.8 % (ref 39.0–52.0)
Hemoglobin: 14.6 g/dL (ref 13.0–17.0)
LYMPHS ABS: 2.7 10*3/uL (ref 0.7–4.0)
LYMPHS PCT: 39 % (ref 12.0–46.0)
MCHC: 34.2 g/dL (ref 30.0–36.0)
MCV: 95 fl (ref 78.0–100.0)
Monocytes Absolute: 0.6 10*3/uL (ref 0.1–1.0)
Monocytes Relative: 8 % (ref 3.0–12.0)
NEUTROS PCT: 51.3 % (ref 43.0–77.0)
Neutro Abs: 3.5 10*3/uL (ref 1.4–7.7)
PLATELETS: 187 10*3/uL (ref 150.0–400.0)
RBC: 4.51 Mil/uL (ref 4.22–5.81)
RDW: 12.8 % (ref 11.5–15.5)
WBC: 6.9 10*3/uL (ref 4.0–10.5)

## 2016-03-16 LAB — LIPID PANEL
CHOL/HDL RATIO: 4
Cholesterol: 158 mg/dL (ref 0–200)
HDL: 38.5 mg/dL — AB (ref >39.00)
LDL CALC: 99 mg/dL (ref 0–99)
NonHDL: 119.7
Triglycerides: 105 mg/dL (ref 0.0–149.0)
VLDL: 21 mg/dL (ref 0.0–40.0)

## 2016-03-16 LAB — HEPATIC FUNCTION PANEL
ALBUMIN: 4.5 g/dL (ref 3.5–5.2)
ALK PHOS: 27 U/L — AB (ref 39–117)
ALT: 9 U/L (ref 0–53)
AST: 12 U/L (ref 0–37)
BILIRUBIN DIRECT: 0.1 mg/dL (ref 0.0–0.3)
Total Bilirubin: 0.5 mg/dL (ref 0.2–1.2)
Total Protein: 7 g/dL (ref 6.0–8.3)

## 2016-03-16 LAB — POC URINALSYSI DIPSTICK (AUTOMATED)
Bilirubin, UA: NEGATIVE
Glucose, UA: NEGATIVE
Ketones, UA: NEGATIVE
Leukocytes, UA: NEGATIVE
Nitrite, UA: NEGATIVE
RBC UA: NEGATIVE
SPEC GRAV UA: 1.025
UROBILINOGEN UA: 1
pH, UA: 7

## 2016-03-16 LAB — BASIC METABOLIC PANEL
BUN: 14 mg/dL (ref 6–23)
CALCIUM: 9.4 mg/dL (ref 8.4–10.5)
CO2: 29 mEq/L (ref 19–32)
CREATININE: 1.14 mg/dL (ref 0.40–1.50)
Chloride: 104 mEq/L (ref 96–112)
GFR: 84.62 mL/min (ref >60.00)
Glucose, Bld: 93 mg/dL (ref 70–99)
Potassium: 4 mEq/L (ref 3.5–5.1)
SODIUM: 138 meq/L (ref 135–145)

## 2016-03-16 LAB — TSH: TSH: 0.45 u[IU]/mL (ref 0.35–4.50)

## 2016-03-16 LAB — URIC ACID: Uric Acid, Serum: 5.5 mg/dL (ref 4.0–7.8)

## 2016-03-16 MED ORDER — INDOMETHACIN 50 MG PO CAPS: 50.0000 mg | ORAL_CAPSULE | Freq: Three times a day (TID) | ORAL | Status: DC | PRN

## 2016-03-16 NOTE — Patient Instructions (Addendum)
It was great meeting you today.   I will follow up with you regarding your labs.   Take the Indomethacin three times a day until the pain is gone. Follow up with me if you continue to have gout flares.   Quit smoking  Follow up in 1-2 years for next physical  Gout Gout is an inflammatory arthritis caused by a buildup of uric acid crystals in the joints. Uric acid is a chemical that is normally present in the blood. When the level of uric acid in the blood is too high it can form crystals that deposit in your joints and tissues. This causes joint redness, soreness, and swelling (inflammation). Repeat attacks are common. Over time, uric acid crystals can form into masses (tophi) near a joint, destroying bone and causing disfigurement. Gout is treatable and often preventable. CAUSES  The disease begins with elevated levels of uric acid in the blood. Uric acid is produced by your body when it breaks down a naturally found substance called purines. Certain foods you eat, such as meats and fish, contain high amounts of purines. Causes of an elevated uric acid level include:  Being passed down from parent to child (heredity).  Diseases that cause increased uric acid production (such as obesity, psoriasis, and certain cancers).  Excessive alcohol use.  Diet, especially diets rich in meat and seafood.  Medicines, including certain cancer-fighting medicines (chemotherapy), water pills (diuretics), and aspirin.  Chronic kidney disease. The kidneys are no longer able to remove uric acid well.  Problems with metabolism. Conditions strongly associated with gout include:  Obesity.  High blood pressure.  High cholesterol.  Diabetes. Not everyone with elevated uric acid levels gets gout. It is not understood why some people get gout and others do not. Surgery, joint injury, and eating too much of certain foods are some of the factors that can lead to gout attacks. SYMPTOMS   An attack of gout  comes on quickly. It causes intense pain with redness, swelling, and warmth in a joint.  Fever can occur.  Often, only one joint is involved. Certain joints are more commonly involved:  Base of the big toe.  Knee.  Ankle.  Wrist.  Finger. Without treatment, an attack usually goes away in a few days to weeks. Between attacks, you usually will not have symptoms, which is different from many other forms of arthritis. DIAGNOSIS  Your caregiver will suspect gout based on your symptoms and exam. In some cases, tests may be recommended. The tests may include:  Blood tests.  Urine tests.  X-rays.  Joint fluid exam. This exam requires a needle to remove fluid from the joint (arthrocentesis). Using a microscope, gout is confirmed when uric acid crystals are seen in the joint fluid. TREATMENT  There are two phases to gout treatment: treating the sudden onset (acute) attack and preventing attacks (prophylaxis).  Treatment of an Acute Attack.  Medicines are used. These include anti-inflammatory medicines or steroid medicines.  An injection of steroid medicine into the affected joint is sometimes necessary.  The painful joint is rested. Movement can worsen the arthritis.  You may use warm or cold treatments on painful joints, depending which works best for you.  Treatment to Prevent Attacks.  If you suffer from frequent gout attacks, your caregiver may advise preventive medicine. These medicines are started after the acute attack subsides. These medicines either help your kidneys eliminate uric acid from your body or decrease your uric acid production. You may need to  stay on these medicines for a very long time.  The early phase of treatment with preventive medicine can be associated with an increase in acute gout attacks. For this reason, during the first few months of treatment, your caregiver may also advise you to take medicines usually used for acute gout treatment. Be sure you  understand your caregiver's directions. Your caregiver may make several adjustments to your medicine dose before these medicines are effective.  Discuss dietary treatment with your caregiver or dietitian. Alcohol and drinks high in sugar and fructose and foods such as meat, poultry, and seafood can increase uric acid levels. Your caregiver or dietitian can advise you on drinks and foods that should be limited. HOME CARE INSTRUCTIONS   Do not take aspirin to relieve pain. This raises uric acid levels.  Only take over-the-counter or prescription medicines for pain, discomfort, or fever as directed by your caregiver.  Rest the joint as much as possible. When in bed, keep sheets and blankets off painful areas.  Keep the affected joint raised (elevated).  Apply warm or cold treatments to painful joints. Use of warm or cold treatments depends on which works best for you.  Use crutches if the painful joint is in your leg.  Drink enough fluids to keep your urine clear or pale yellow. This helps your body get rid of uric acid. Limit alcohol, sugary drinks, and fructose drinks.  Follow your dietary instructions. Pay careful attention to the amount of protein you eat. Your daily diet should emphasize fruits, vegetables, whole grains, and fat-free or low-fat milk products. Discuss the use of coffee, vitamin C, and cherries with your caregiver or dietitian. These may be helpful in lowering uric acid levels.  Maintain a healthy body weight. SEEK MEDICAL CARE IF:   You develop diarrhea, vomiting, or any side effects from medicines.  You do not feel better in 24 hours, or you are getting worse. SEEK IMMEDIATE MEDICAL CARE IF:   Your joint becomes suddenly more tender, and you have chills or a fever. MAKE SURE YOU:   Understand these instructions.  Will watch your condition.  Will get help right away if you are not doing well or get worse.   This information is not intended to replace advice  given to you by your health care provider. Make sure you discuss any questions you have with your health care provider.   Document Released: 09/29/2000 Document Revised: 10/23/2014 Document Reviewed: 05/15/2012 Elsevier Interactive Patient Education [employer]

## 2016-03-16 NOTE — Progress Notes (Signed)
Patient presents to clinic today to establish care. He is a pleasant 25 year old male who  has a past medical history of History of ear infection.   Acute Concerns: Annual Physical   Gout  - He has been having more recent gout flares. They are happening monthly nad last for 2-3 days. Endorses pain in his right great toe as well as his left thumb. Has not been taking any medication for the pain. Has a family history of gout  Chronic Issues: Tobacco Use- Smokes about 1 pack per day. He knows he needs to quit but is not ready to do so yet.   Health Maintenance: Dental -- Does not see on a regular basis  Vision -- Does not see Immunizations --UTD Diet: Tries to eat healthy Exercise Multiple times for week - run, weight lifting   Past Medical History  Diagnosis Date  . History of ear infection     Past Surgical History  Procedure Laterality Date  . Tympanostomy tube placement    . Appendectomy  03/24/2015  . Laparoscopic appendectomy N/A 03/24/2015    Procedure: APPENDECTOMY LAPAROSCOPIC;  Surgeon: Gaynelle Adu, MD;  Location: [facility];  Service: General;  Laterality: N/A;    No current outpatient prescriptions on file prior to visit.   No current facility-administered medications on file prior to visit.    No Known Allergies  Family History  Problem Relation Age of Onset  . Cancer Father     Prostate  . Diabetes Father   . Diabetes Paternal Uncle   . Heart disease Maternal Grandfather   . Diabetes Paternal Grandfather   . Gout Father   . Gout Brother   . Gout Paternal Grandfather     Social History   Social History  . Marital Status: Single    Spouse Name: N/A  . Number of Children: N/A  . Years of Education: N/A   Occupational History  . Not on file.   Social History Main Topics  . Smoking status: Current Every Day Smoker -- 1.00 packs/day for 4 years    Types: Cigarettes  . Smokeless tobacco: Never Used  . Alcohol Use: 0.0 oz/week    0 Standard drinks  or equivalent per week  . Drug Use: Yes    Special: Marijuana     Comment: weekly   . Sexual Activity: Not on file   Other Topics Concern  . Not on file   Social History Narrative   Work in [employer]    Some college   Not married   No kids       Review of Systems  Constitutional: Negative.   HENT: Negative.   Eyes: Negative.   Respiratory: Negative.   Cardiovascular: Negative.   Gastrointestinal: Negative.   Genitourinary: Negative.   Musculoskeletal: Positive for joint pain (rigth great toe).  Skin: Negative.   Neurological: Negative.   Endo/Heme/Allergies: Negative.   Psychiatric/Behavioral: Negative.     BP 100/70 mmHg  Temp(Src) 97.7 F (36.5 C) (Oral)  Ht 6' (1.829 m)  Wt 142 lb 8 oz (64.638 kg)  BMI 19.32 kg/m2  Physical Exam  Constitutional: He is oriented to person, place, and time and well-developed, well-nourished, and in no distress. No distress.  HENT:  Head: Normocephalic and atraumatic.  Right Ear: External ear normal.  Left Ear: External ear normal.  Nose: Nose normal.  Mouth/Throat: Oropharynx is clear and moist. No oropharyngeal exudate.  Eyes: Conjunctivae and EOM are normal. Right eye exhibits  no discharge. No scleral icterus.  Neck: Normal range of motion. Neck supple. No JVD present. No tracheal deviation present. No thyromegaly present.  Cardiovascular: Normal rate, regular rhythm, normal heart sounds and intact distal pulses.  Exam reveals no gallop.   No murmur heard. Pulmonary/Chest: Effort normal and breath sounds normal. No stridor. No respiratory distress. He has no wheezes. He has no rales. He exhibits no tenderness.  Abdominal: Soft. Bowel sounds are normal. He exhibits no distension and no mass. There is no tenderness. There is no rebound.  Musculoskeletal: Normal range of motion. He exhibits tenderness. He exhibits no edema.  Tenderness and trace redness to right MTP joint in right toe.   Lymphadenopathy:    He has no cervical  adenopathy.  Neurological: He is alert and oriented to person, place, and time. He has normal reflexes. He displays normal reflexes. No cranial nerve deficit. He exhibits normal muscle tone. Gait normal. Coordination normal. GCS score is 15.  Skin: Skin is warm and dry. No rash noted. No erythema. No pallor.  Psychiatric: Mood, memory, affect and judgment normal.  Nursing note and vitals reviewed.   Assessment/Plan:  1. Routine general medical examination at a health care facility - POCT Urinalysis Dipstick (Automated) - Basic metabolic panel - CBC with Differential/Platelet - Hepatic function panel - Lipid panel - TSH - Continue to exercise and eat healthy - He needs to quit smoking. Advised to cut back and when he is ready to quit and inform me and I would help him.  - Follow up in 1-2 years or sooner if needed  2. Gouty arthritis of toe, right - Uric Acid - indomethacin (INDOCIN) 50 MG capsule; Take 1 capsule (50 mg total) by mouth 3 (three) times daily as needed.  Dispense: 30 capsule; Refill: 6 - Follow up if no improvement in gout flares  Shirline Freesory Natalin Bible, NP

## 2016-05-09 IMAGING — CT CT ABD-PELV W/ CM
2 of 4 series · 17 of 46 positions shown, 19 images · IV contrast (APPLIED)
Comparison: None.

CLINICAL DATA: Abdominal pain

EXAM:
CT ABDOMEN AND PELVIS WITH CONTRAST
TECHNIQUE: Multidetector CT imaging of the abdomen and pelvis was performed
using the standard protocol following bolus administration of
intravenous contrast.
CONTRAST:  100 mL Isovue 370 intravenous

[Series 2: abd/ pelvis 5.0 i30f 1 · axial · 0.72mm/px · z∈[-531,-111]mm · 14 of 94 slices shown, 16 images]
[im 5/94  soft-tissue]
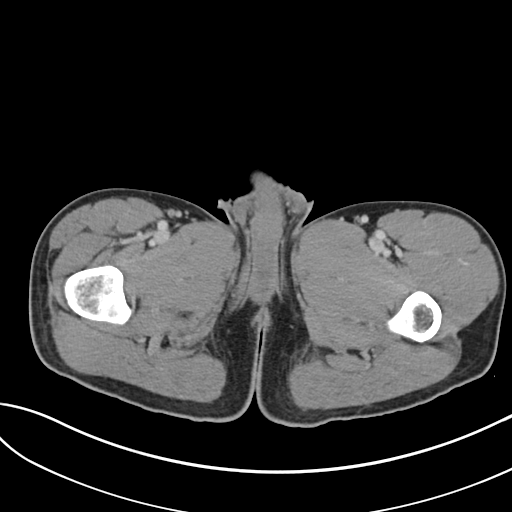
[im 5/94  bone]
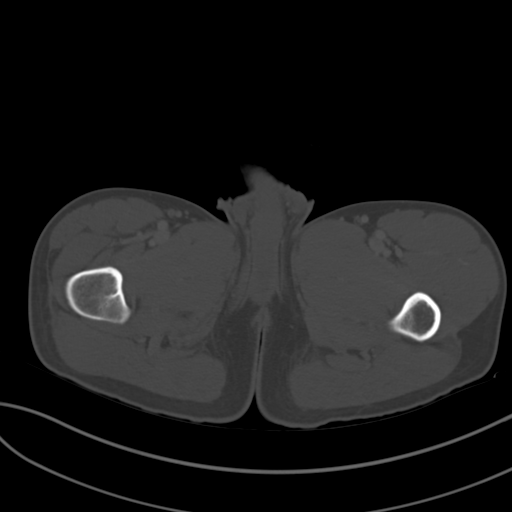
[im 13/94  soft-tissue]
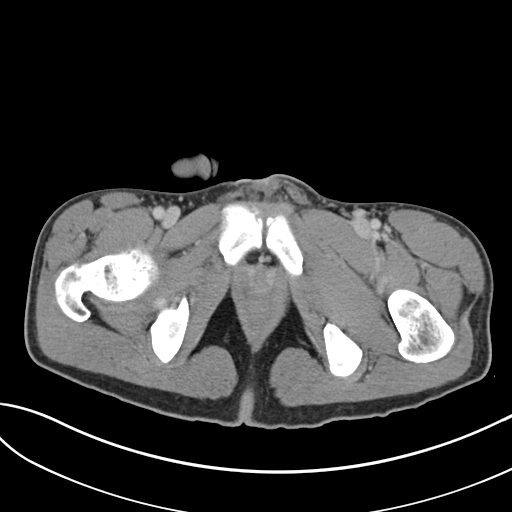
[im 17/94  soft-tissue]
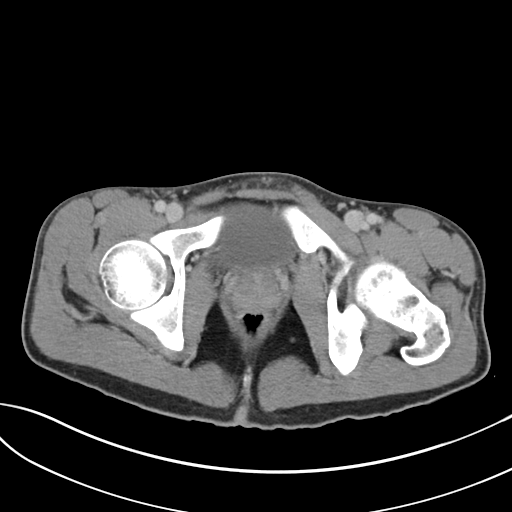
[im 25/94  soft-tissue]
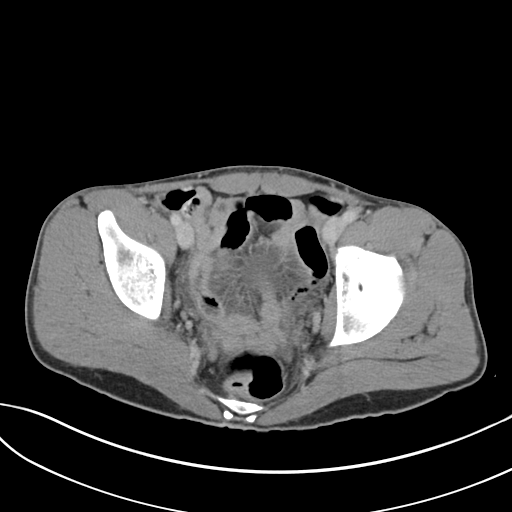
[im 33/94  soft-tissue]
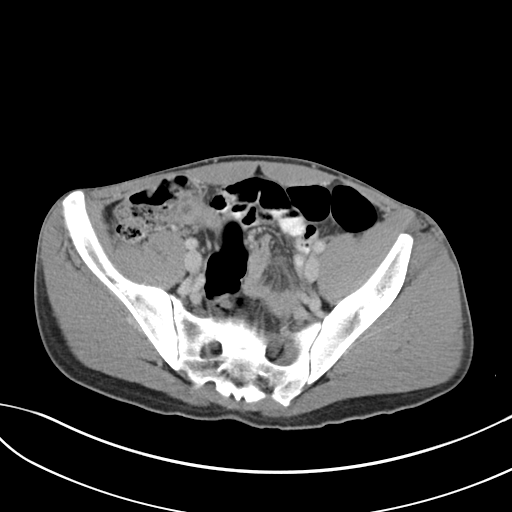
[im 37/94  soft-tissue]
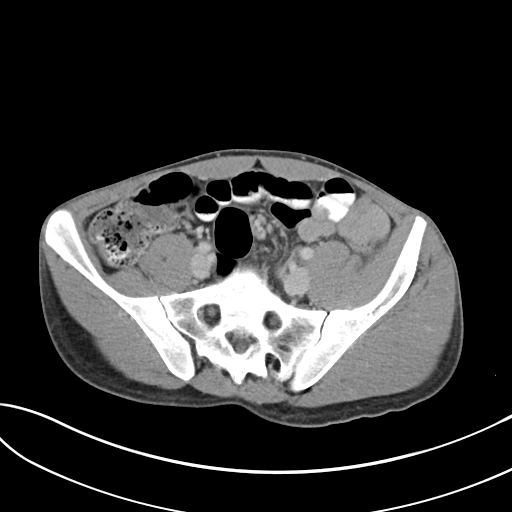
[im 45/94  soft-tissue]
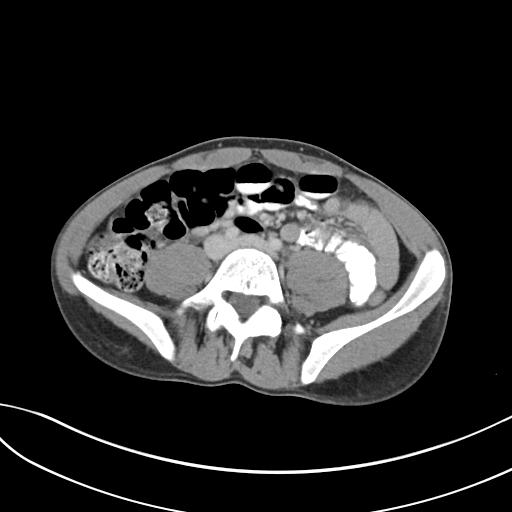
[im 49/94  soft-tissue]
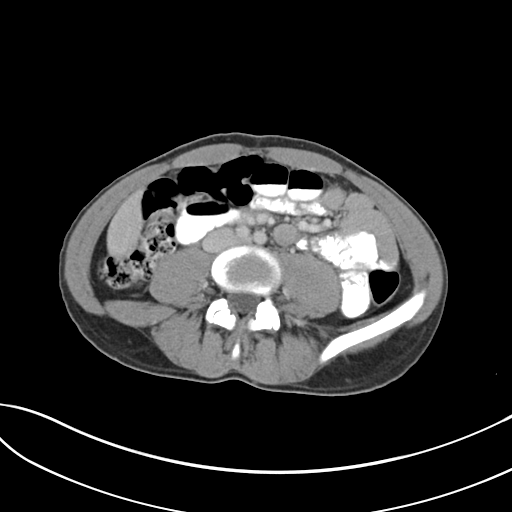
[im 57/94  soft-tissue]
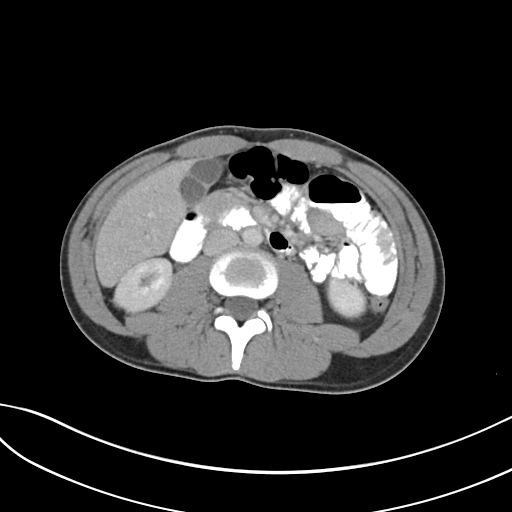
[im 57/94  bone]
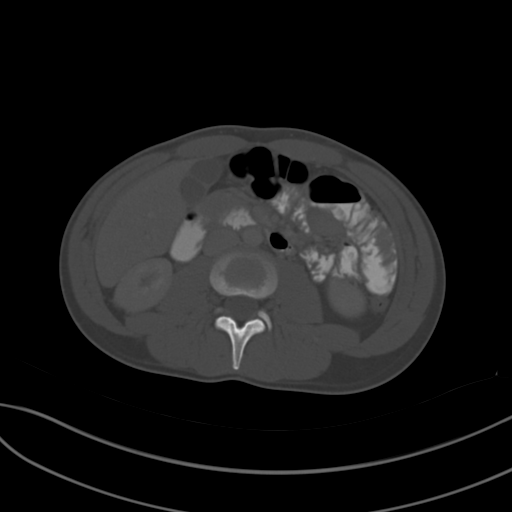
[im 61/94  soft-tissue]
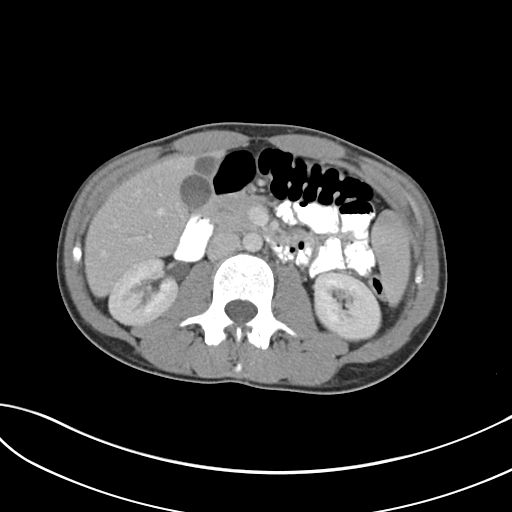
[im 69/94  soft-tissue]
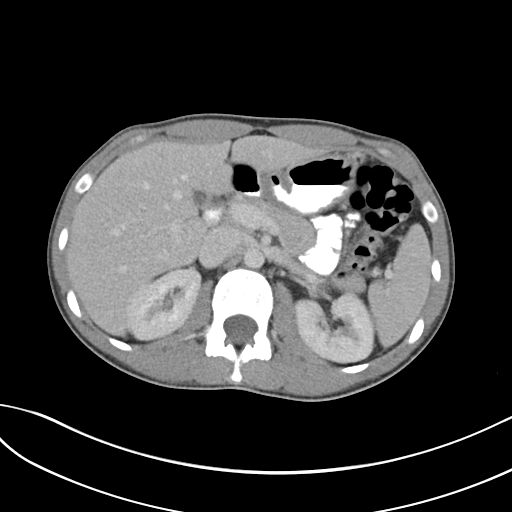
[im 77/94  soft-tissue]
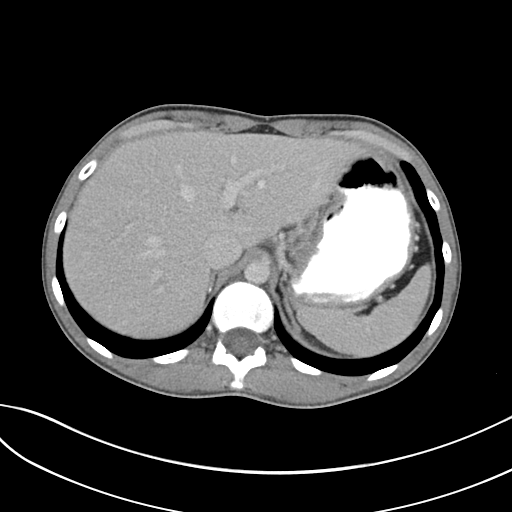
[im 81/94  soft-tissue]
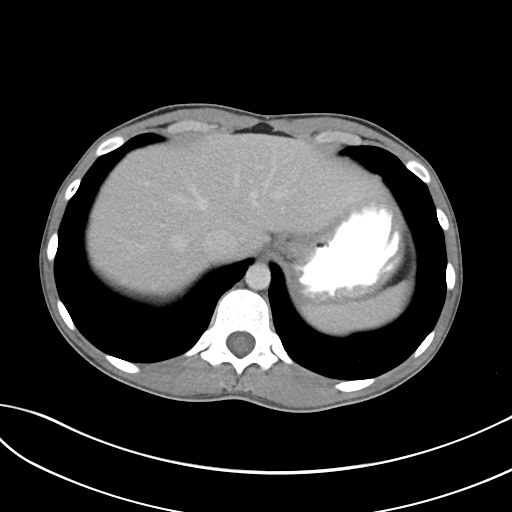
[im 89/94  soft-tissue]
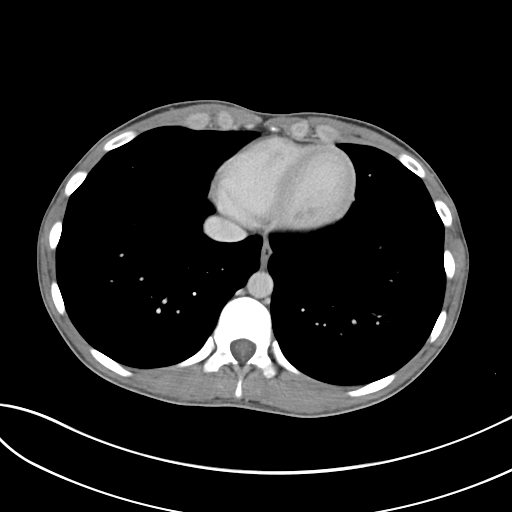

[Series 5: coronal soft tissue · coronal · 0.78mm/px · 3 of 69 slices shown]
[im 23/69  soft-tissue]
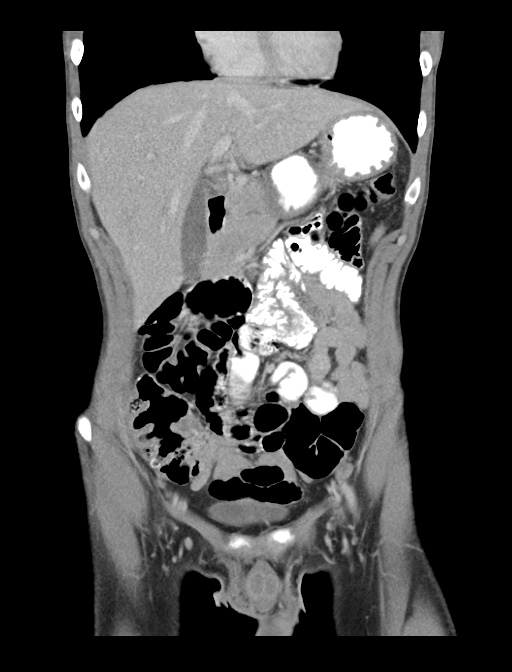
[im 31/69  soft-tissue]
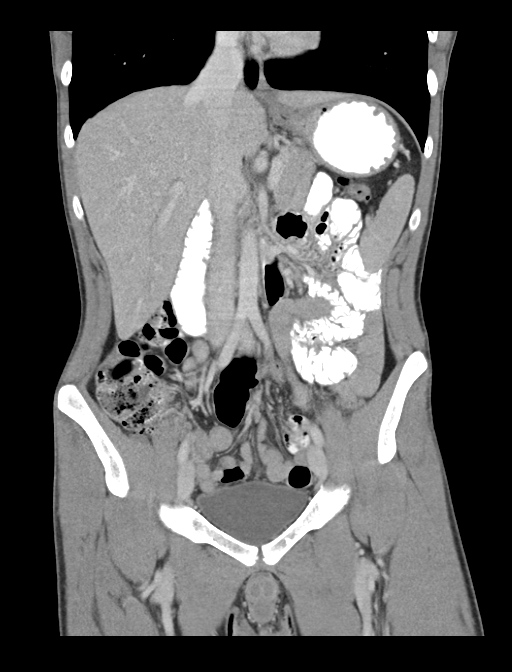
[im 38/69  soft-tissue]
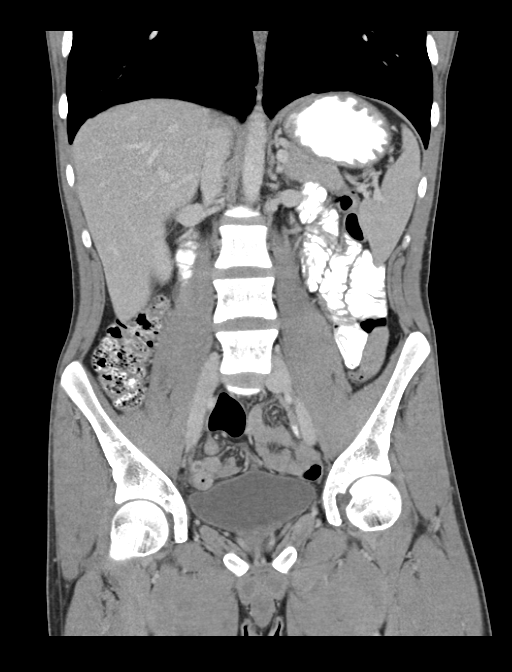

[17 of 46 positions shown; findings below may reference images not displayed]

FINDINGS: There is moderate dilatation and mural thickening of the appendix.
There is an appendicolith at the appendiceal base. The findings are
typical of acute appendicitis. There is no abscess. There is no
drainable fluid collection.

There are normal appearances of the liver, spleen, pancreas,
adrenals and kidneys.

There is no extraluminal air. There is no bowel obstruction. No
other acute findings are evident.

There is no significant abnormality in the lower chest.
IMPRESSION: Acute appendicitis.

## 2017-03-20 ENCOUNTER — Ambulatory Visit (INDEPENDENT_AMBULATORY_CARE_PROVIDER_SITE_OTHER): Payer: Managed Care, Other (non HMO) | Admitting: Adult Health

## 2017-03-20 ENCOUNTER — Encounter: Payer: Self-pay | Admitting: Adult Health

## 2017-03-20 VITALS — BP 102/60 | Temp 97.7°F | Ht 72.0 in | Wt 140.7 lb

## 2017-03-20 DIAGNOSIS — Z720 Tobacco use: Secondary | ICD-10-CM

## 2017-03-20 DIAGNOSIS — Z0000 Encounter for general adult medical examination without abnormal findings: Secondary | ICD-10-CM | POA: Diagnosis not present

## 2017-03-20 DIAGNOSIS — M1049 Other secondary gout, multiple sites: Secondary | ICD-10-CM

## 2017-03-20 DIAGNOSIS — Z23 Encounter for immunization: Secondary | ICD-10-CM | POA: Diagnosis not present

## 2017-03-20 LAB — HEPATIC FUNCTION PANEL
ALBUMIN: 4.5 g/dL (ref 3.5–5.2)
ALK PHOS: 32 U/L — AB (ref 39–117)
ALT: 7 U/L (ref 0–53)
AST: 12 U/L (ref 0–37)
Bilirubin, Direct: 0.1 mg/dL (ref 0.0–0.3)
TOTAL PROTEIN: 6.8 g/dL (ref 6.0–8.3)
Total Bilirubin: 0.3 mg/dL (ref 0.2–1.2)

## 2017-03-20 LAB — CBC WITH DIFFERENTIAL/PLATELET
Basophils Absolute: 0 10*3/uL (ref 0.0–0.1)
Basophils Relative: 0.4 % (ref 0.0–3.0)
Eosinophils Absolute: 0.2 10*3/uL (ref 0.0–0.7)
Eosinophils Relative: 2.4 % (ref 0.0–5.0)
HCT: 41.5 % (ref 39.0–52.0)
Hemoglobin: 14.3 g/dL (ref 13.0–17.0)
LYMPHS PCT: 51 % — AB (ref 12.0–46.0)
Lymphs Abs: 3.4 10*3/uL (ref 0.7–4.0)
MCHC: 34.5 g/dL (ref 30.0–36.0)
MCV: 94.5 fl (ref 78.0–100.0)
MONOS PCT: 9 % (ref 3.0–12.0)
Monocytes Absolute: 0.6 10*3/uL (ref 0.1–1.0)
NEUTROS ABS: 2.5 10*3/uL (ref 1.4–7.7)
Neutrophils Relative %: 37.2 % — ABNORMAL LOW (ref 43.0–77.0)
Platelets: 204 10*3/uL (ref 150.0–400.0)
RBC: 4.4 Mil/uL (ref 4.22–5.81)
RDW: 12.2 % (ref 11.5–15.5)
WBC: 6.8 10*3/uL (ref 4.0–10.5)

## 2017-03-20 LAB — LIPID PANEL
Cholesterol: 183 mg/dL (ref 0–200)
HDL: 31.6 mg/dL — AB (ref >39.00)
NonHDL: 151.05
Total CHOL/HDL Ratio: 6
Triglycerides: 360 mg/dL — ABNORMAL HIGH (ref 0.0–149.0)
VLDL: 72 mg/dL — ABNORMAL HIGH (ref 0.0–40.0)

## 2017-03-20 LAB — TSH: TSH: 0.87 u[IU]/mL (ref 0.35–4.50)

## 2017-03-20 LAB — BASIC METABOLIC PANEL
BUN: 16 mg/dL (ref 6–23)
CO2: 26 meq/L (ref 19–32)
Calcium: 9.2 mg/dL (ref 8.4–10.5)
Chloride: 107 mEq/L (ref 96–112)
Creatinine, Ser: 1.13 mg/dL (ref 0.40–1.50)
GFR: 84.74 mL/min (ref >60.00)
Glucose, Bld: 82 mg/dL (ref 70–99)
POTASSIUM: 4.3 meq/L (ref 3.5–5.1)
SODIUM: 140 meq/L (ref 135–145)

## 2017-03-20 LAB — LDL CHOLESTEROL, DIRECT: Direct LDL: 99 mg/dL

## 2017-03-20 NOTE — Addendum Note (Signed)
Addended by: Janelle FloorHOMPSON, Keshonda Monsour B on: 03/20/2017 07:41 AM   Modules accepted: Orders

## 2017-03-20 NOTE — Patient Instructions (Signed)
It was great seeing you today   I will follow up with you regarding your lab work   You have been given a Tetanus booster in the office today   Have a happy birthday! I will see you in one year or sooner if needed  QUIT SMOKING!!

## 2017-03-20 NOTE — Progress Notes (Signed)
Subjective:    Patient ID: Keith Glenn, male    DOB: 05/19/1991, 26 y.o.   MRN: [id number]  HPI  Patient presents for yearly preventative medicine examination. He is a pleasant, active and healthy 26 year old male who  has a past medical history of History of ear infection.   All immunizations and health maintenance protocols were reviewed with the patient and needed orders were placed. He needs tdap   Appropriate screening laboratory values were ordered for the patient including screening of hyperlipidemia, renal function and hepatic function. If indicated by BPH, a PSA was ordered.  Medication reconciliation,  past medical history, social history, problem list and allergies were reviewed in detail with the patient  Goals were established with regard to weight loss, exercise, and  diet in compliance with medications.Marland Kitchen. He eats pretty healthy and works out 3-4 days per week.   Gout - Has had 1-2 flares over the last year. He has been watching his diet to reduce flares.  Tobacco Use - He has been cutting back and has been down to 1/4 - 1/2 pack. He does not want to try Chantix.   He denies any acute issues    Review of Systems  Constitutional: Negative.   HENT: Negative.   Eyes: Negative.   Respiratory: Negative.   Cardiovascular: Negative.   Gastrointestinal: Negative.   Endocrine: Negative.   Genitourinary: Negative.   Musculoskeletal: Negative.   Skin: Negative.   Allergic/Immunologic: Negative.   Neurological: Negative.   Hematological: Negative.   Psychiatric/Behavioral: Negative.   All other systems reviewed and are negative.  Past Medical History:  Diagnosis Date  . History of ear infection     Social History   Social History  . Marital status: Single    Spouse name: N/A  . Number of children: N/A  . Years of education: N/A   Occupational History  . Not on file.   Social History Main Topics  . Smoking status: Current Every Day Smoker   Packs/day: 1.00    Years: 4.00    Types: Cigarettes  . Smokeless tobacco: Never Used  . Alcohol use 0.0 oz/week  . Drug use: Yes    Types: Marijuana     Comment: weekly   . Sexual activity: Not on file   Other Topics Concern  . Not on file   Social History Narrative   Work in [employer]    Some college   Not married   No kids       Past Surgical History:  Procedure Laterality Date  . APPENDECTOMY  03/24/2015  . LAPAROSCOPIC APPENDECTOMY N/A 03/24/2015   Procedure: APPENDECTOMY LAPAROSCOPIC;  Surgeon: Gaynelle AduEric [name], MD;  [facility];  Service: General;  Laterality: N/A;  . TYMPANOSTOMY TUBE PLACEMENT      Family History  Problem Relation Age of Onset  . Cancer Father        Prostate  . Diabetes Father   . Diabetes Paternal Uncle   . Heart disease Maternal Grandfather   . Diabetes Paternal Grandfather   . Gout Father   . Gout Brother   . Gout Paternal Grandfather     No Known Allergies  No current outpatient prescriptions on file prior to visit.   No current facility-administered medications on file prior to visit.     BP 102/60 (BP Location: Left Arm, Patient Position: Sitting, Cuff Size: Normal)   Temp 97.7 F (36.5 C) (Oral)   Ht 6' (1.829 m)  Wt 140 lb 11.2 oz (63.8 kg)   BMI 19.08 kg/m       Objective:   Physical Exam  Constitutional: He is oriented to person, place, and time. He appears well-developed and well-nourished. No distress.  HENT:  Head: Normocephalic and atraumatic.  Right Ear: External ear normal.  Left Ear: External ear normal.  Nose: Nose normal.  Mouth/Throat: Oropharynx is clear and moist. No oropharyngeal exudate.  Eyes: Conjunctivae are normal. Pupils are equal, round, and reactive to light. Right eye exhibits no discharge. Left eye exhibits no discharge.  Neck: Normal range of motion. Neck supple. No JVD present. No tracheal deviation present. No thyromegaly present.  Cardiovascular: Normal rate, regular rhythm, normal heart  sounds and intact distal pulses.  Exam reveals no gallop and no friction rub.   No murmur heard. Pulmonary/Chest: Effort normal and breath sounds normal. No stridor. No respiratory distress. He has no wheezes. He has no rales. He exhibits no tenderness.  Abdominal: Soft. Bowel sounds are normal. He exhibits no distension and no mass. There is no tenderness. There is no rebound and no guarding.  Musculoskeletal: Normal range of motion. He exhibits no edema, tenderness or deformity.  Lymphadenopathy:    He has no cervical adenopathy.  Neurological: He is alert and oriented to person, place, and time. He has normal reflexes. He displays normal reflexes. No cranial nerve deficit. He exhibits normal muscle tone. Coordination normal.  Skin: Skin is warm and dry. No rash noted. He is not diaphoretic. No erythema. No pallor.  Psychiatric: He has a normal mood and affect. His behavior is normal. Judgment and thought content normal.  Nursing note and vitals reviewed.     Assessment & Plan:  1. Routine general medical examination at a health care facility - Tdap given  - Basic metabolic panel - CBC with Differential/Platelet - Hepatic function panel - Lipid panel - TSH - HIV antibody  2. Other secondary acute gout of multiple sites - Continue to pay close attention to diet and trigger foods - Nsaids as needed  3. Tobacco use - Encouraged to quit for overall healthy  - Follow up if he ever decides he wants to try Chantix    Shirline Frees, NP

## 2017-03-21 LAB — HIV ANTIBODY (ROUTINE TESTING W REFLEX): HIV 1&2 Ab, 4th Generation: NONREACTIVE

## 2017-12-12 ENCOUNTER — Ambulatory Visit: Payer: Managed Care, Other (non HMO) | Admitting: Adult Health

## 2019-06-24 ENCOUNTER — Other Ambulatory Visit: Payer: Self-pay

## 2019-06-24 ENCOUNTER — Telehealth (INDEPENDENT_AMBULATORY_CARE_PROVIDER_SITE_OTHER): Payer: Self-pay | Admitting: Adult Health

## 2019-06-24 DIAGNOSIS — J020 Streptococcal pharyngitis: Secondary | ICD-10-CM

## 2019-06-24 MED ORDER — PENICILLIN V POTASSIUM 500 MG PO TABS: 500.0000 mg | ORAL_TABLET | Freq: Three times a day (TID) | ORAL | 0 refills | Status: AC

## 2019-06-24 MED ORDER — PREDNISONE 20 MG PO TABS: 20.0000 mg | ORAL_TABLET | Freq: Every day | ORAL | 0 refills | Status: AC

## 2019-06-24 NOTE — Progress Notes (Signed)
Virtual Visit via Video Note  I connected with Keith Glenn on 06/24/19 at  4:00 PM EDT by a video enabled telemedicine application and verified that I am speaking with the correct person using two identifiers.  Location patient: home Location provider:work or home office Persons participating in the virtual visit: patient, provider  I discussed the limitations of evaluation and management by telemedicine and the availability of in person appointments. The patient expressed understanding and agreed to proceed.   HPI: 28 year old male who is being evaluated today for concern of strep throat.  His symptoms started 3 days ago with a fever up to 103, currently at 99.5.  He also reports pain when swallowing, feeling of tightness in his throat, mild shortness of breath, white spots on the back of his throat and tonsils as well as his tonsils being swollen.  Things have been becoming progressively worse.  Denies trouble swallowing or feeling as though he is choking.  Taking over-the-counter pain medication for symptom relief  Has not had any known exposure to strep   ROS: See pertinent positives and negatives per HPI.  Past Medical History:  Diagnosis Date  . History of ear infection     Past Surgical History:  Procedure Laterality Date  . APPENDECTOMY  03/24/2015  . LAPAROSCOPIC APPENDECTOMY N/A 03/24/2015   Procedure: APPENDECTOMY LAPAROSCOPIC;  Surgeon: Greer Pickerel, MD;  Location: [facility];  Service: General;  Laterality: N/A;  . TYMPANOSTOMY TUBE PLACEMENT      Family History  Problem Relation Age of Onset  . Cancer Father        Prostate  . Diabetes Father   . Gout Father   . Diabetes Paternal Uncle   . Heart disease Maternal Grandfather   . Diabetes Paternal Grandfather   . Gout Paternal Grandfather   . Gout Brother     No current outpatient medications on file.  EXAM:  VITALS per patient if applicable:  GENERAL: alert, oriented, appears well and in no acute  distress  HEENT: atraumatic, conjunttiva clear, no obvious abnormalities on inspection of external nose and ears  NECK: normal movements of the head and neck  LUNGS: on inspection no signs of respiratory distress, breathing rate appears normal, no obvious gross SOB, gasping or wheezing  CV: no obvious cyanosis  MS: moves all visible extremities without noticeable abnormality  PSYCH/NEURO: pleasant and cooperative, no obvious depression or anxiety, speech and thought processing grossly intact  ASSESSMENT AND PLAN:  Discussed the following assessment and plan:  1. Strep throat -We will treat for strep throat due to symptoms.  Advised not to return to work for 24 hours after starting antibiotics.  Salt water gargle and cool drinks to help with throat discomfort.  Advised to go to the emergency room difficulty breathing or swallowing. - penicillin v potassium (VEETID) 500 MG tablet; Take 1 tablet (500 mg total) by mouth 3 (three) times daily for 10 days.  Dispense: 30 tablet; Refill: 0 - predniSONE (DELTASONE) 20 MG tablet; Take 1 tablet (20 mg total) by mouth daily with breakfast.  Dispense: 5 tablet; Refill: 0     I discussed the assessment and treatment plan with the patient. The patient was provided an opportunity to ask questions and all were answered. The patient agreed with the plan and demonstrated an understanding of the instructions.   The patient was advised to call back or seek an in-person evaluation if the symptoms worsen or if the condition fails to improve as anticipated.  Dorothyann Peng, NP

## 2019-06-25 ENCOUNTER — Encounter: Payer: Self-pay | Admitting: Adult Health
